# Patient Record
Sex: Male | Born: 1995 | Race: White | Hispanic: No | Marital: Single | Smoking: Never smoker
Health system: Southern US, Community
[De-identification: ages and names within clinical notes are randomized; demographics above are authoritative.]

---

## 2014-01-25 ENCOUNTER — Ambulatory Visit (INDEPENDENT_AMBULATORY_CARE_PROVIDER_SITE_OTHER): Payer: No Typology Code available for payment source | Admitting: Family Medicine

## 2014-01-25 VITALS — BP 110/68 | HR 66 | Temp 97.5°F | Resp 18 | Ht 72.0 in | Wt 131.2 lb

## 2014-01-25 DIAGNOSIS — J029 Acute pharyngitis, unspecified: Secondary | ICD-10-CM

## 2014-01-25 DIAGNOSIS — R5383 Other fatigue: Secondary | ICD-10-CM

## 2014-01-25 DIAGNOSIS — R5381 Other malaise: Secondary | ICD-10-CM

## 2014-01-25 DIAGNOSIS — J0120 Acute ethmoidal sinusitis, unspecified: Secondary | ICD-10-CM

## 2014-01-25 DIAGNOSIS — R5382 Chronic fatigue, unspecified: Secondary | ICD-10-CM

## 2014-01-25 LAB — POCT CBC
Granulocyte percent: 56.6 %G (ref 37–80)
HCT, POC: 51.4 % (ref 43.5–53.7)
Hemoglobin: 16.9 g/dL (ref 14.1–18.1)
Lymph, poc: 2 (ref 0.6–3.4)
MCH, POC: 29.8 pg (ref 27–31.2)
MCHC: 32.9 g/dL (ref 31.8–35.4)
MCV: 90.5 fL (ref 80–97)
MID (cbc): 0.4 (ref 0–0.9)
MPV: 7.7 fL (ref 0–99.8)
POC Granulocyte: 3.2 (ref 2–6.9)
POC LYMPH PERCENT: 35.9 % (ref 10–50)
POC MID %: 7.5 % (ref 0–12)
Platelet Count, POC: 260 10*3/uL (ref 142–424)
RBC: 5.68 M/uL (ref 4.69–6.13)
RDW, POC: 13.4 %
WBC: 5.7 10*3/uL (ref 4.6–10.2)

## 2014-01-25 MED ORDER — AZITHROMYCIN 250 MG PO TABS: ORAL_TABLET | ORAL | Status: AC

## 2014-01-25 NOTE — Progress Notes (Addendum)
Chief Complaint:  Chief Complaint  Patient presents with  . Sore Throat    Started with sore throat, has been feeling ill for for about a month now  . Headache  . Sinus Congestion  . Fatigue    A lot of fatigue, very tired    HPI: Chad Howell is a 18 y.o. male who is here for  1 month history of feeling fatigue. He has also 1 week history of green nasal drainage without sinus pressure.+ Nasal congestion, green stuff, Denies allergies He is a Printmaker at [employer], he is from [place] originally, playing golf for [employer] 1 month hisotry of fatigue but no weight loss, swollen glands, night sweats, He has no nausea, vomiting abd pain, no polyuria or polydipsia, chest pain/palpitations, SOB No stressors. No cough, no ear pain no facial pain, no rashes, no neck pain. He is UTD on all his vaccines.  Here with dad who is a GI doctor for the [facility] clinic, he is worried that Aneta Mins may have mono so would like just a CBC and EBV panel done  There is no family h/o thyroid disease or diabetes   History reviewed. No pertinent past medical history. History reviewed. No pertinent past surgical history. History   Social History  . Marital Status: Single    Spouse Name: N/A    Number of Children: N/A  . Years of Education: N/A   Social History Main Topics  . Smoking status: Never Smoker   . Smokeless tobacco: None  . Alcohol Use: No  . Drug Use: No  . Sexual Activity: None   Other Topics Concern  . None   Social History Narrative  . None   Family History  Problem Relation Age of Onset  . Hypertension Mother   . Hypertension Father    No Known Allergies Prior to Admission medications   Not on File     ROS: The patient denies fevers, chills, night sweats, unintentional weight loss, chest pain, palpitations, wheezing, dyspnea on exertion, nausea, vomiting, abdominal pain, dysuria, hematuria, melena, numbness,  or tingling.   All other systems have been reviewed and were  otherwise negative with the exception of those mentioned in the HPI and as above.    PHYSICAL EXAM: Filed Vitals:   01/25/14 1156  BP: 110/68  Pulse: 66  Temp: 97.5 F (36.4 C)  Resp: 18   Filed Vitals:   01/25/14 1156  Height: 6' (1.829 m)  Weight: 131 lb 3.2 oz (59.512 kg)   Body mass index is 17.79 kg/(m^2).  General: Alert, no acute distress HEENT:  Normocephalic, atraumatic, oropharynx patent. EOMI, PERRLA, tm nl with some scarring, no thyroid megaly, neg for sinus tenderness Cardiovascular:  Regular rate and rhythm, no rubs murmurs or gallops.  Radial pulse intact. No pedal edema.  Respiratory: Clear to auscultation bilaterally.  No wheezes, rales, or rhonchi.  No cyanosis, no use of accessory musculature GI: No organomegaly, abdomen is soft and non-tender, positive bowel sounds.  No masses. Skin: No rashes. Neurologic: Facial musculature symmetric. Psychiatric: Patient is appropriate throughout our interaction. Lymphatic: No cervical lymphadenopathy Musculoskeletal: Gait intact. Neg meningeal signs, full ROM of neck   LABS: Results for orders placed in visit on 01/25/14  POCT CBC      Result Value Ref Range   WBC 5.7  4.6 - 10.2 K/uL   Lymph, poc 2.0  0.6 - 3.4   POC LYMPH PERCENT 35.9  10 - 50 %L  MID (cbc) 0.4  0 - 0.9   POC MID % 7.5  0 - 12 %M   POC Granulocyte 3.2  2 - 6.9   Granulocyte percent 56.6  37 - 80 %G   RBC 5.68  4.69 - 6.13 M/uL   Hemoglobin 16.9  14.1 - 18.1 g/dL   HCT, POC 16.1  09.6 - 53.7 %   MCV 90.5  80 - 97 fL   MCH, POC 29.8  27 - 31.2 pg   MCHC 32.9  31.8 - 35.4 g/dL   RDW, POC 04.5     Platelet Count, POC 260  142 - 424 K/uL   MPV 7.7  0 - 99.8 fL     EKG/XRAY:   Primary read interpreted by Dr. Conley Rolls at [facility].   ASSESSMENT/PLAN: Encounter Diagnoses  Name Primary?  . Chronic fatigue Yes  . Acute pharyngitis, unspecified pharyngitis type   . Acute ethmoidal sinusitis, recurrence not specified    Rx z pack for sinus sxs and  if allergies then use otc nasacort/zyrtec F/u with labs: EBV  Declined TSH, CMP F/u by phone  Gross sideeffects, risk and benefits, and alternatives of medications d/w patient. Patient is aware that all medications have potential sideeffects and we are unable to predict every sideeffect or drug-drug interaction that may occur.  Hamilton Capri PHUONG, DO 01/25/2014 12:52 PM

## 2014-01-25 NOTE — Patient Instructions (Signed)

## 2014-01-27 ENCOUNTER — Encounter: Payer: Self-pay | Admitting: Family Medicine

## 2014-01-28 LAB — EPSTEIN-BARR VIRUS VCA ANTIBODY PANEL
EBV EA IgG: <5 U/mL (ref <9.0)
EBV NA IgG: <3 U/mL (ref <18.0)
EBV VCA IgG: <10 U/mL (ref <18.0)
EBV VCA IgM: <10 U/mL (ref <36.0)

## 2014-07-26 ENCOUNTER — Other Ambulatory Visit: Payer: Self-pay | Admitting: Family Medicine

## 2015-01-15 ENCOUNTER — Ambulatory Visit
Admission: RE | Admit: 2015-01-15 | Discharge: 2015-01-15 | Disposition: A | Discharge status: Home / Self Care | Payer: No Typology Code available for payment source | Source: Ambulatory Visit | Attending: Family Medicine | Admitting: Family Medicine

## 2015-01-15 ENCOUNTER — Other Ambulatory Visit: Payer: Self-pay | Admitting: Family Medicine

## 2015-01-15 DIAGNOSIS — M545 Low back pain: Secondary | ICD-10-CM | POA: Diagnosis not present

## 2015-01-15 DIAGNOSIS — R52 Pain, unspecified: Secondary | ICD-10-CM

## 2016-01-10 DIAGNOSIS — G43109 Migraine with aura, not intractable, without status migrainosus: Secondary | ICD-10-CM | POA: Insufficient documentation

## 2016-01-10 DIAGNOSIS — R51 Headache: Secondary | ICD-10-CM | POA: Diagnosis present

## 2016-01-10 NOTE — ED Triage Notes (Signed)
Patient reports migraine for the past 3-4 hours.  Reports lights appeared to be flashing.  Reports has had one other migraine and this feels the same.

## 2016-01-11 ENCOUNTER — Emergency Department
Admission: EM | Admit: 2016-01-11 | Discharge: 2016-01-11 | Disposition: A | Discharge status: Home / Self Care | Payer: PRIVATE HEALTH INSURANCE | Attending: Emergency Medicine | Admitting: Emergency Medicine

## 2016-01-11 DIAGNOSIS — G43109 Migraine with aura, not intractable, without status migrainosus: Secondary | ICD-10-CM

## 2016-01-11 MED ORDER — DIPHENHYDRAMINE HCL 25 MG PO CAPS: 25.0000 mg | ORAL_CAPSULE | Freq: Four times a day (QID) | ORAL | 0 refills | Status: AC | PRN

## 2016-01-11 MED ORDER — METOCLOPRAMIDE HCL 10 MG PO TABS: 10.0000 mg | ORAL_TABLET | Freq: Four times a day (QID) | ORAL | 0 refills | Status: AC | PRN

## 2016-01-11 NOTE — ED Provider Notes (Signed)
[facility] Emergency Department Provider Note  ____________________________________________  Time seen: Approximately 1:20 AM  I have reviewed the triage vital signs and the nursing notes.   HISTORY  Chief Complaint Migraine    HPI Chad Howell is a 20 y.o. male who complains of a right-sided temporal headache that occurred just behind the eyes, throbbing, with associated visual disturbance and nausea that started about 8:30 PM tonight. He took ibuprofen as action now starting to feel better. No vomiting. No fevers chills or recent head injury. No neck pain or stiffness. He is on the golf team at the local college and he's been spending hours outside and he notes that because the color whether he did not drink much fluids today.  He has had one previous migraine headache 4 years ago and this feels similar.   No past medical history on file. None  There are no active problems to display for this patient.    No past surgical history on file. None  Prior to Admission medications   Medication Sig Start Date End Date Taking? Authorizing Provider  azithromycin (ZITHROMAX) 250 MG tablet Take 2 tabs po now then 1 tab po daily 01/25/14   Thao P Le, DO  diphenhydrAMINE (BENADRYL) 25 mg capsule Take 1 capsule (25 mg total) by mouth every 6 (six) hours as needed. 01/11/16   Sharman CheekPhillip Kelcy Laible, MD  metoCLOPramide (REGLAN) 10 MG tablet Take 1 tablet (10 mg total) by mouth every 6 (six) hours as needed. 01/11/16   Sharman CheekPhillip Juanna Pudlo, MD     Allergies Review of patient's allergies indicates no known allergies.   Family History  Problem Relation Age of Onset  . Hypertension Mother   . Hypertension Father     Social History Social History  Substance Use Topics  . Smoking status: Never Smoker  . Smokeless tobacco: Not on file  . Alcohol use No    Review of Systems  Constitutional:   No fever or chills.  ENT:   No sore throat. No rhinorrhea.  Respiratory:    No dyspnea or cough. Gastrointestinal:   Negative for abdominal pain, vomiting and diarrhea.  Neurological:   Positive as above for headaches 10-point ROS otherwise negative.  ____________________________________________   PHYSICAL EXAM:  VITAL SIGNS: ED Triage Vitals  Enc Vitals Group     BP 01/10/16 2155 (!) 155/94     Pulse Rate 01/10/16 2155 94     Resp 01/10/16 2155 20     Temp 01/10/16 2155 97.5 F (36.4 C)     Temp Source 01/10/16 2155 Oral     SpO2 01/10/16 2155 100 %     Weight 01/10/16 2152 135 lb (61.2 kg)     Height 01/10/16 2152 6' (1.829 m)     Head Circumference --      Peak Flow --      Pain Score 01/10/16 2152 7     Pain Loc --      Pain Edu? --      Excl. in GC? --     Vital signs reviewed, nursing assessments reviewed.   Constitutional:   Alert and oriented. Well appearing and in no distress. Eyes:   No scleral icterus. No conjunctival pallor. PERRL. EOMI.  No nystagmus. ENT   Head:   Normocephalic and atraumatic.      Mouth/Throat:   MMM, no pharyngeal erythema. No peritonsillar mass.    Neck:   No stridor. No SubQ emphysema. No meningismus. Hematological/Lymphatic/Immunilogical:   No  cervical lymphadenopathy. Cardiovascular:   RRR. Symmetric bilateral radial and DP pulses.  No murmurs.  Respiratory:   Normal respiratory effort without tachypnea nor retractions. Breath sounds are clear and equal bilaterally. No wheezes/rales/rhonchi.  Neurologic:   Normal speech and language.  CN 2-10 normal. Motor grossly intact. No gross focal neurologic deficits are appreciated.  Skin:    Skin is warm, dry and intact. No rash noted.  No petechiae, purpura, or bullae.  ____________________________________________    LABS (pertinent positives/negatives) (all labs ordered are listed, but only abnormal results are displayed) Labs Reviewed - No data to  display ____________________________________________   EKG    ____________________________________________    RADIOLOGY    ____________________________________________   PROCEDURES Procedures  ____________________________________________   INITIAL IMPRESSION / ASSESSMENT AND PLAN / ED COURSE  Pertinent labs & imaging results that were available during my care of the patient were reviewed by me and considered in my medical decision making (see chart for details).  Patient well appearing no acute distress. Symptoms are resolving after taking NSAIDs for his headache. He is tolerating oral intake, sitting upright, no further treatment warranted in the emergency department. We'll discharge home with prescription for Reglan and Benadryl to take as needed. Counseled on maintaining good hydration while engaging in his team sports outside.  Considering the patient's symptoms, medical history, and physical examination today, I have low suspicion for ischemic stroke, intracranial hemorrhage, meningitis, encephalitis, carotid or vertebral dissection, venous sinus thrombosis, MS, intracranial hypertension, glaucoma, CRAO, CRVO, or temporal arteritis.    Clinical Course   ____________________________________________   FINAL CLINICAL IMPRESSION(S) / ED DIAGNOSES  Final diagnoses:  Migraine with aura and without status migrainosus, not intractable       Portions of this note were generated with dragon dictation software. Dictation errors may occur despite best attempts at proofreading.    Sharman Cheek, MD 01/11/16 [phone]

## 2016-01-11 NOTE — ED Notes (Signed)
MD at bedside. 

## 2017-08-01 ENCOUNTER — Encounter: Payer: Self-pay | Admitting: Family Medicine

## 2017-08-01 ENCOUNTER — Other Ambulatory Visit: Payer: Self-pay | Admitting: Family Medicine

## 2017-08-01 ENCOUNTER — Ambulatory Visit (INDEPENDENT_AMBULATORY_CARE_PROVIDER_SITE_OTHER): Payer: No Typology Code available for payment source | Admitting: Family Medicine

## 2017-08-01 VITALS — BP 125/78 | HR 92 | Resp 14

## 2017-08-01 DIAGNOSIS — S3981XA Other specified injuries of abdomen, initial encounter: Secondary | ICD-10-CM

## 2017-08-01 DIAGNOSIS — R1031 Right lower quadrant pain: Secondary | ICD-10-CM

## 2017-08-01 MED ORDER — DICLOFENAC SODIUM 75 MG PO TBEC: 75.0000 mg | DELAYED_RELEASE_TABLET | Freq: Two times a day (BID) | ORAL | 0 refills | Status: AC

## 2017-08-02 ENCOUNTER — Ambulatory Visit
Admission: RE | Admit: 2017-08-02 | Discharge: 2017-08-02 | Disposition: A | Discharge status: Home / Self Care | Payer: PRIVATE HEALTH INSURANCE | Source: Ambulatory Visit | Attending: Family Medicine | Admitting: Family Medicine

## 2017-08-02 DIAGNOSIS — S3981XA Other specified injuries of abdomen, initial encounter: Secondary | ICD-10-CM | POA: Diagnosis present

## 2017-08-02 DIAGNOSIS — R1031 Right lower quadrant pain: Secondary | ICD-10-CM | POA: Insufficient documentation

## 2017-08-10 ENCOUNTER — Ambulatory Visit (INDEPENDENT_AMBULATORY_CARE_PROVIDER_SITE_OTHER): Payer: No Typology Code available for payment source | Admitting: Family Medicine

## 2017-08-10 ENCOUNTER — Encounter: Payer: Self-pay | Admitting: Family Medicine

## 2017-08-10 DIAGNOSIS — S3981XD Other specified injuries of abdomen, subsequent encounter: Secondary | ICD-10-CM

## 2017-08-10 NOTE — Progress Notes (Signed)
Patient presents for follow-up regarding right groin/adductor pain. Patient states that his symptoms have not improved or worsened. He has rested for 1 week. He denies any testicular pain. He denies any problems with bowel movements or urinary symptoms. He denies any bulge that he can see or feel. Reviewed his MRI pelvis results with him. Patient states that [place] and did not see anything significant as well. Has not done any physical activity since his last visit with me. Patient denies having any problems doing regular activities during the day.  ROS: Negative except mentioned above.  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 Patient addresses understanding and states that his father would agree with this plan. Discussed with patient that I would be happy to speak with his father if patient requested.

## 2017-08-18 NOTE — Progress Notes (Signed)
Patient presents today with symptoms of lower right sided abdominal pain extending into the groin area. Patient states that the symptoms increase when he swings during golf. He has been treating the area as a strain with his trainer over the last few weeks but now the symptoms are worse and going into the groin area. He denies any bulging or any pain into his testicle area. He denies any problems with bowel movements. He denies any history of a hernia in the past. He denies problems with walking or straining. He denies any fever, diarrhea or constipation. His coach is in the examining room today and patient is fine with that.  ROS: Negative except mentioned above.  GENERAL: NAD HEENT: no pharyngeal erythema, no exudate, no erythema of TMs, no cervical LAD RESP: CTA B CARD: RRR ABD: positive bowel sounds, mild discomfort in the right lower rectus abdominis muscle, no mass or bulging appreciated, no rebound or guarding, no inguinal hernia appreciated MSK: full range of motion of both hips without pain, mild discomfort on the right symphysis pubis, mild discomfort with doing a sit up in the region of pain above and with resisted adduction of lower extremities NEURO: CN II-XII grossly intact   A/P: Right abdominal/groin pain - no obvious inguinal hernia appreciated on exam, will do further evaluation with imaging, discussed possibility of sports hernia as well with patient, discussed that this is usually a diagnosis of exclusion, patient states that his father is a gastroenterologist and is aware of his symptoms, discussed with patient that I would be happy to talk to his father if he requested, if any worsening symptoms advised patient to go to the ER as discussed. Would refrain from activities that cause symptoms such as golf for now. Will see how 1 week of rest helps him. NSAIDs when necessary for pain. Will follow up with me if needed. Above was discussed with coach and trainer.

## 2018-09-20 IMAGING — MR MR PELVIS W/O CM
5 of 7 series · 34 of 48 positions shown · non-contrast
Comparison: None.

CLINICAL DATA: Intermittent right groin pain near the pubic
symphysis. Evaluate for sports hernia.

EXAM:
MRI PELVIS WITHOUT CONTRAST
TECHNIQUE: Multiplanar multisequence MR imaging of the pelvis was performed. No
intravenous contrast was administered.

[Series 2: STIR · coronal · 4.0mm · 0.70mm/px · 8 of 37 slices shown]
[im 1/37]
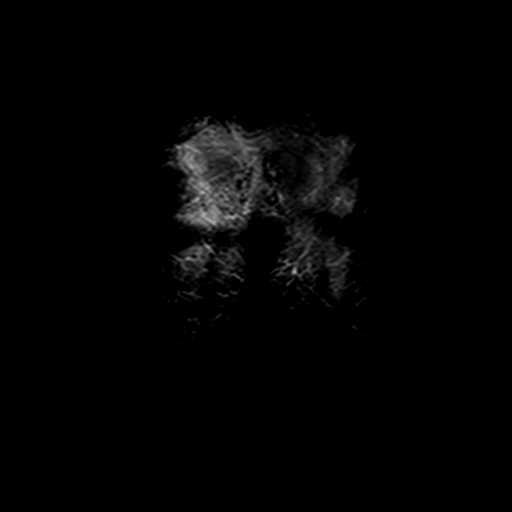
[im 6/37]
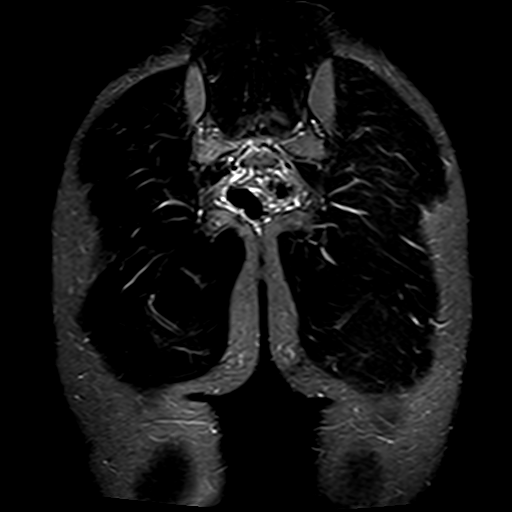
[im 11/37]
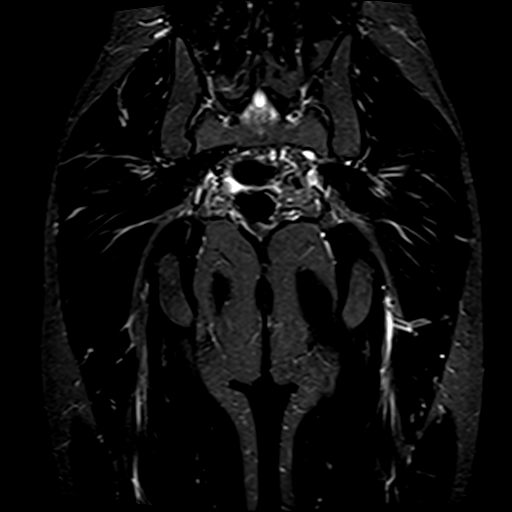
[im 16/37]
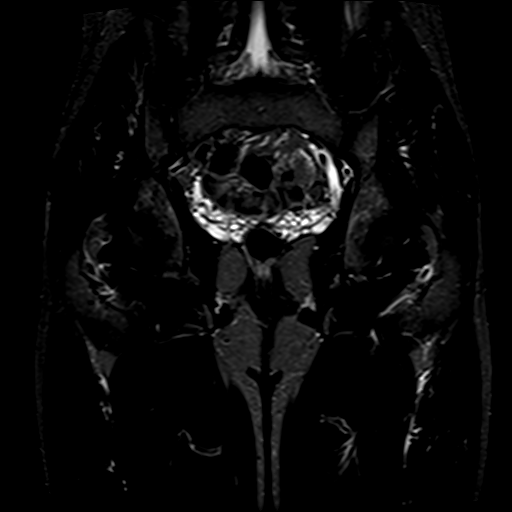
[im 21/37]
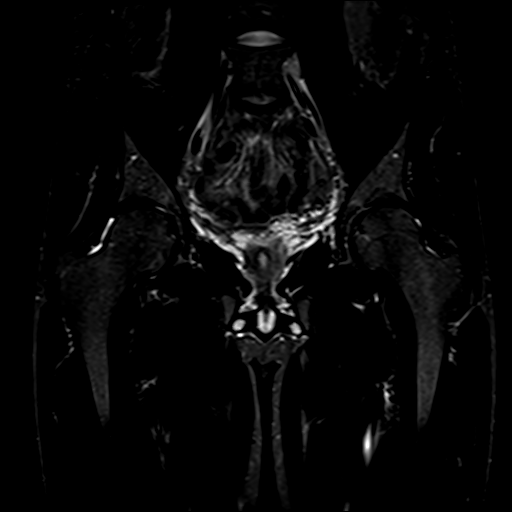
[im 26/37]
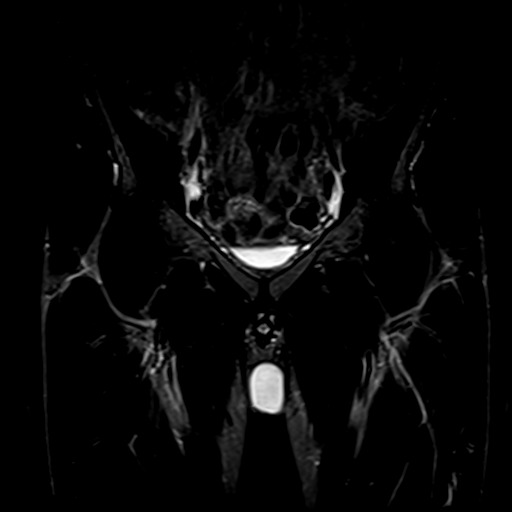
[im 31/37]
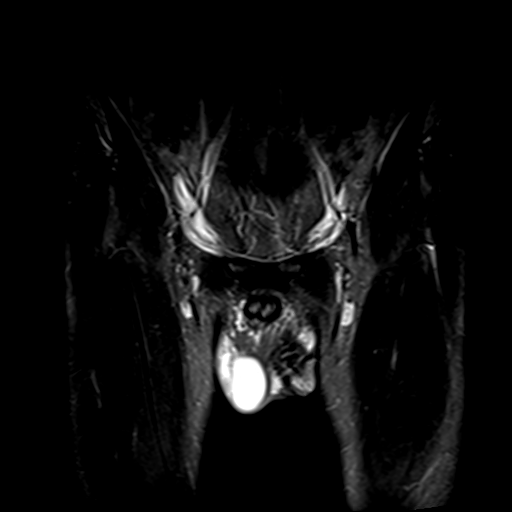
[im 37/37]
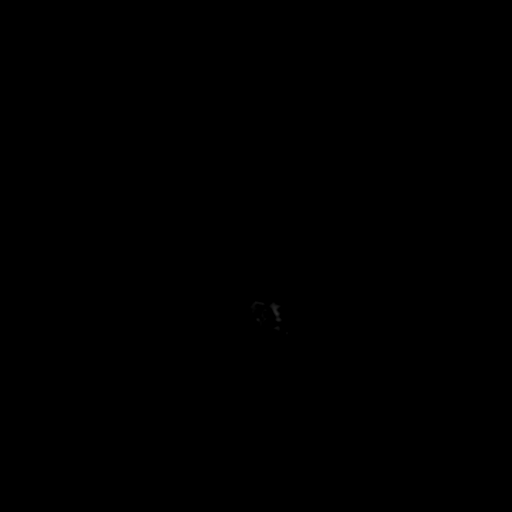

[Series 3: T1 · coronal · 4.0mm · 1.09mm/px · 8 of 37 slices shown]
[im 1/37]
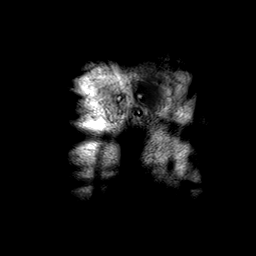
[im 6/37]
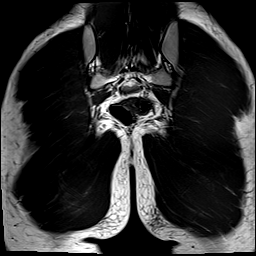
[im 11/37]
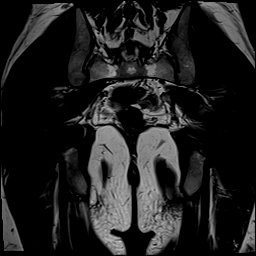
[im 16/37]
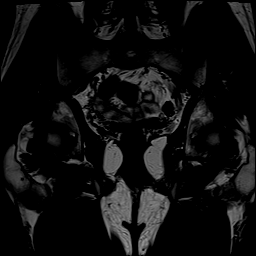
[im 21/37]
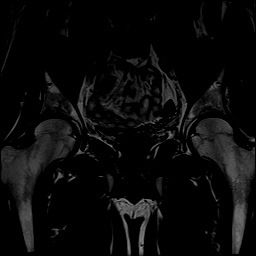
[im 26/37]
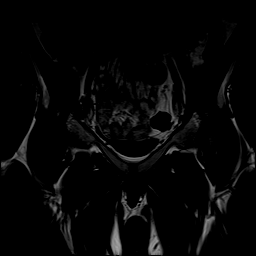
[im 31/37]
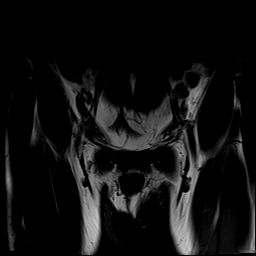
[im 37/37]
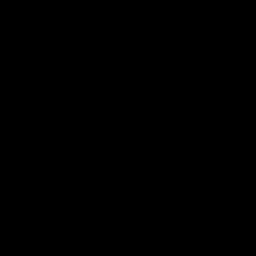

[Series 4: T2 fat-sat · axial · 4.0mm · 1.09mm/px · z∈[-36,+104]mm · 6 of 30 slices shown (1 of 2)]
[im 1/30]
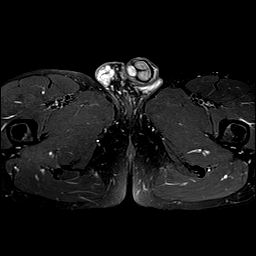
[im 6/30]
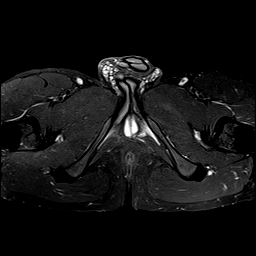
[im 12/30]
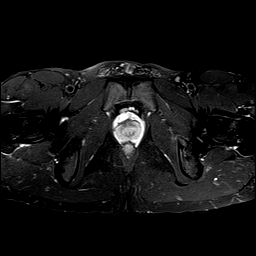
[im 18/30]
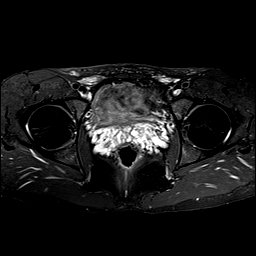
[im 24/30]
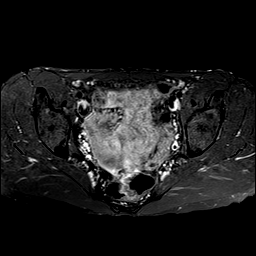
[im 30/30]
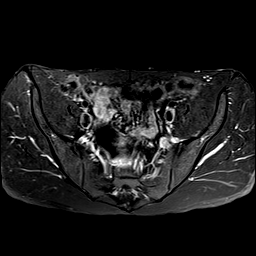

[Series 5: T2 fat-sat · sagittal · 4.0mm · 0.86mm/px · 8 of 37 slices shown (2 of 2)]
[im 1/37]
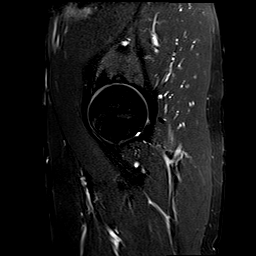
[im 6/37]
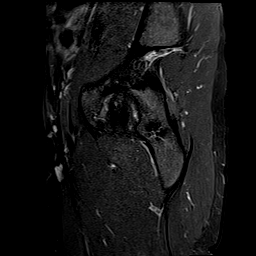
[im 11/37]
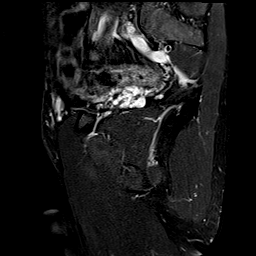
[im 16/37]
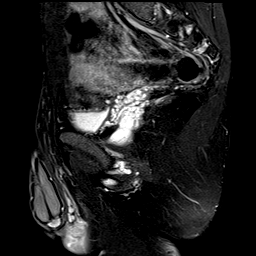
[im 21/37]
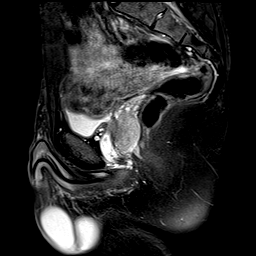
[im 26/37]
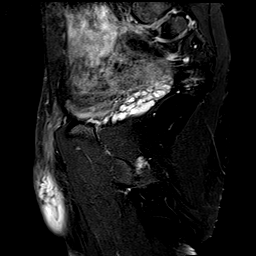
[im 31/37]
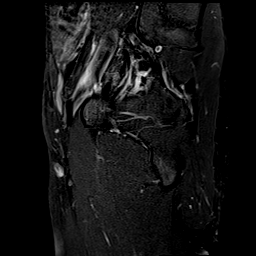
[im 37/37]
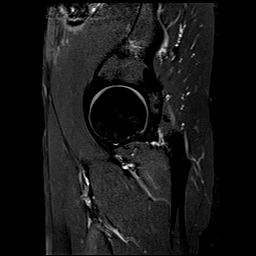

[Series 6: PD · oblique · 4.0mm · 0.39mm/px · 4 of 25 slices shown]
[im 1/25]
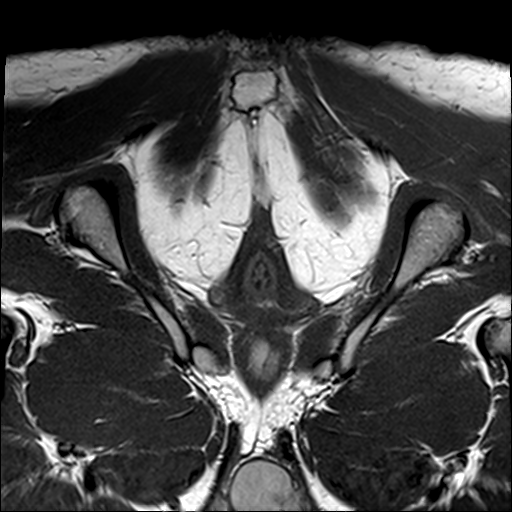
[im 7/25]
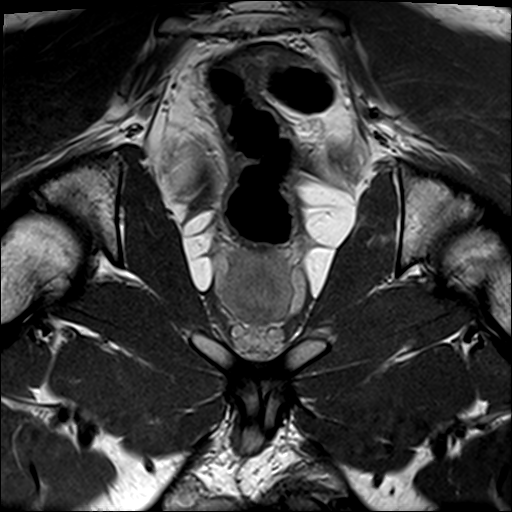
[im 13/25]
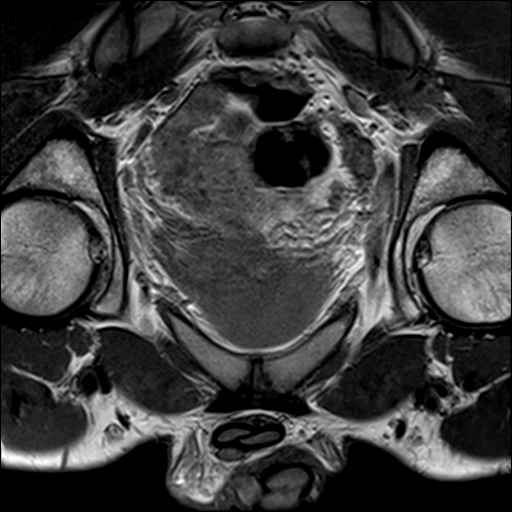
[im 19/25]
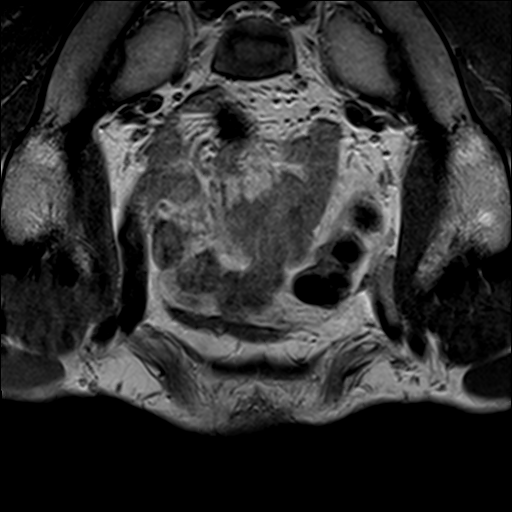

[34 of 48 positions shown; findings below may reference images not displayed]

FINDINGS: Bones: There is no evidence of acute fracture, dislocation or
avascular necrosis. The visualized bony pelvis appears normal. The
visualized sacroiliac joints and symphysis pubis appear normal.

Articular cartilage and labrum

Articular cartilage: No focal chondral defect or subchondral signal
abnormality identified.

Labrum: There is no gross labral tear or paralabral abnormality.

Joint or bursal effusion

Joint effusion: No significant hip joint effusion.

Bursae: No focal periarticular fluid collection.

Muscles and tendons

Muscles and tendons: The bilateral common adductor-rectus abdominis
aponeuroses are unremarkable. The visualized gluteus, hamstring and
iliopsoas tendons appear normal. No muscle edema or atrophy..

Other findings

Miscellaneous: The visualized internal pelvic contents appear
unremarkable.
IMPRESSION: 1. No evidence of athletic pubalgia. No acute abnormality to explain
the patient's pain.
